# Patient Record
Sex: Male | Born: 1970 | Race: White | Hispanic: No | Marital: Single | Smoking: Current every day smoker
Health system: Southern US, Community
[De-identification: ages and names within clinical notes are randomized; demographics above are authoritative.]

## PROBLEM LIST (undated history)

## (undated) DIAGNOSIS — K219 Gastro-esophageal reflux disease without esophagitis: Secondary | ICD-10-CM

## (undated) DIAGNOSIS — I10 Essential (primary) hypertension: Secondary | ICD-10-CM

---

## 2016-02-17 ENCOUNTER — Emergency Department (HOSPITAL_BASED_OUTPATIENT_CLINIC_OR_DEPARTMENT_OTHER)
Admission: EM | Admit: 2016-02-17 | Discharge: 2016-02-18 | Discharge status: Against Medical Advice | Payer: Self-pay | Attending: Emergency Medicine | Admitting: Emergency Medicine

## 2016-02-17 ENCOUNTER — Other Ambulatory Visit: Payer: Self-pay

## 2016-02-17 ENCOUNTER — Emergency Department (HOSPITAL_BASED_OUTPATIENT_CLINIC_OR_DEPARTMENT_OTHER): Payer: Self-pay

## 2016-02-17 ENCOUNTER — Encounter (HOSPITAL_BASED_OUTPATIENT_CLINIC_OR_DEPARTMENT_OTHER): Payer: Self-pay

## 2016-02-17 DIAGNOSIS — R0789 Other chest pain: Secondary | ICD-10-CM | POA: Insufficient documentation

## 2016-02-17 DIAGNOSIS — Z79899 Other long term (current) drug therapy: Secondary | ICD-10-CM | POA: Insufficient documentation

## 2016-02-17 DIAGNOSIS — F17210 Nicotine dependence, cigarettes, uncomplicated: Secondary | ICD-10-CM | POA: Insufficient documentation

## 2016-02-17 DIAGNOSIS — R079 Chest pain, unspecified: Secondary | ICD-10-CM

## 2016-02-17 DIAGNOSIS — I10 Essential (primary) hypertension: Secondary | ICD-10-CM | POA: Insufficient documentation

## 2016-02-17 HISTORY — DX: Gastro-esophageal reflux disease without esophagitis: K21.9

## 2016-02-17 HISTORY — DX: Essential (primary) hypertension: I10

## 2016-02-17 LAB — CBC
HCT: 44.6 % (ref 39.0–52.0)
Hemoglobin: 15.9 g/dL (ref 13.0–17.0)
MCH: 33.5 pg (ref 26.0–34.0)
MCHC: 35.7 g/dL (ref 30.0–36.0)
MCV: 93.9 fL (ref 78.0–100.0)
PLATELETS: 205 10*3/uL (ref 150–400)
RBC: 4.75 MIL/uL (ref 4.22–5.81)
RDW: 13.5 % (ref 11.5–15.5)
WBC: 6.8 10*3/uL (ref 4.0–10.5)

## 2016-02-17 LAB — BASIC METABOLIC PANEL
Anion gap: 9 (ref 5–15)
BUN: 8 mg/dL (ref 6–20)
CALCIUM: 9 mg/dL (ref 8.9–10.3)
CO2: 28 mmol/L (ref 22–32)
CREATININE: 0.94 mg/dL (ref 0.61–1.24)
Chloride: 101 mmol/L (ref 101–111)
GFR calc non Af Amer: >60 mL/min (ref >60)
Glucose, Bld: 103 mg/dL — ABNORMAL HIGH (ref 65–99)
Potassium: 4 mmol/L (ref 3.5–5.1)
SODIUM: 138 mmol/L (ref 135–145)

## 2016-02-17 LAB — TROPONIN I

## 2016-02-17 MED ORDER — ASPIRIN 81 MG PO CHEW
324.0000 mg | CHEWABLE_TABLET | Freq: Once | ORAL | Status: AC
  Administered 2016-02-17: 324 mg via ORAL
  Filled 2016-02-17: qty 4

## 2016-02-17 MED ORDER — NITROGLYCERIN 0.4 MG SL SUBL
0.4000 mg | SUBLINGUAL_TABLET | SUBLINGUAL | Status: DC | PRN
  Filled 2016-02-17: qty 1

## 2016-02-17 NOTE — ED Notes (Signed)
EKG was done in triage

## 2016-02-17 NOTE — ED Notes (Signed)
PA at bedside.

## 2016-02-17 NOTE — ED Notes (Signed)
Patient complains of chest heaviness x 3 hours. Reports the pain started after drinking a couple of beers and eating chicken. Takes omeprazole daily for GERD. Describes as uncomfortable feeling

## 2016-02-17 NOTE — ED Provider Notes (Signed)
CSN: [id number]     Arrival date & time 02/17/16  2101 History  By signing my name below, I, Majel Homereyton Lee, attest that this documentation has been prepared under the direction and in the presence of non-physician practitioner, Burna FortsJeff Caroly Purewal, PA-C. Electronically Signed: Majel HomerPeyton Lee, Scribe. 02/17/2016. 11:09 PM.   Chief Complaint  Patient presents with  . Chest Pain   The history is provided by the patient and the spouse. No language interpreter was used.   HPI Comments: Zachary Roman is a 45 y.o. male with PMHx of HTN and GERD, who presents to the Emergency Department complaining of gradual onset, right lower chest pressure that began at ~7:00pm this evening. He notes his chest pressure spreads across to his left chest as well. Pt states it feels as if "an elephant is sitting on his chest." Pt also reports his pressure is exacerbated when taking deep breaths. Pt states his symptoms began soon after eating dinner and drinking a few beers; he notes he recently ate barbecue chicken and noodles. Pt notes 1 episode of vomiting this evening. Pt notes he takes omeprazole twice daily for GERD. He reports he has experienced similar symptoms before in which he was seen at [facility] and told he was having a panic attack. Pt notes hx of pancreatitis. He reports he smokes at least 1 pack of cigarettes every day since he was 7. He denies FMHx of MI, hx of MI, blood clots, heart problems, DM, and HLD. Pt also denies leg swelling and any recent surgeries.   Past Medical History  Diagnosis Date  . Hypertension   . GERD (gastroesophageal reflux disease)    History reviewed. No pertinent past surgical history. No family history on file. Social History  Substance Use Topics  . Smoking status: Current Every Day Smoker    Types: Cigarettes  . Smokeless tobacco: None  . Alcohol Use: None    Review of Systems  Cardiovascular: Positive for chest pain. Negative for leg swelling.  Gastrointestinal:  Negative for vomiting.   Allergies  Review of patient's allergies indicates no known allergies.  Home Medications   Prior to Admission medications   Medication Sig Start Date End Date Taking? Authorizing Provider  omeprazole (PRILOSEC) 20 MG capsule Take 20 mg by mouth daily.   Yes Historical Provider, MD   BP 152/116 mmHg  Pulse 65  Temp(Src) 97.9 F (36.6 C) (Oral)  Resp 16  Ht 5\' 8"  (1.727 m)  Wt 68.04 kg  BMI 22.81 kg/m2  SpO2 99% Physical Exam  Constitutional: He is oriented to person, place, and time. He appears well-developed and well-nourished.  HENT:  Head: Normocephalic and atraumatic.  Eyes: Conjunctivae are normal. Pupils are equal, round, and reactive to light. Right eye exhibits no discharge. Left eye exhibits no discharge. No scleral icterus.  Neck: Normal range of motion. No JVD present. No tracheal deviation present.  Cardiovascular: Normal rate, regular rhythm, normal heart sounds and intact distal pulses.  Exam reveals no gallop and no friction rub.   No murmur heard. Pulmonary/Chest: Effort normal and breath sounds normal. No stridor. No respiratory distress.  Abdominal: Soft. He exhibits no distension and no mass. There is no tenderness. There is no rebound and no guarding.  Musculoskeletal: He exhibits no edema or tenderness.  Neurological: He is alert and oriented to person, place, and time. Coordination normal.  Skin: Skin is warm and dry. No rash noted. No erythema. No pallor.  Psychiatric: He has a normal mood and  affect. His behavior is normal. Judgment and thought content normal.  Nursing note and vitals reviewed.   ED Course  Procedures  DIAGNOSTIC STUDIES:  Oxygen Saturation is 100% on RA, normal by my interpretation.    COORDINATION OF CARE:  11:01 PM Discussed treatment plan with pt at bedside and pt agreed to plan.  Labs Review Labs Reviewed  BASIC METABOLIC PANEL - Abnormal; Notable for the following:    Glucose, Bld 103 (*)    All  other components within normal limits  CBC  TROPONIN I  TROPONIN I    Imaging Review Dg Chest 2 View  02/17/2016  CLINICAL DATA:  3 hour history of chest heaviness. EXAM: CHEST  2 VIEW COMPARISON:  No comparison studies available. FINDINGS: Hyperexpansion is noted bilaterally. The lungs are clear wiithout focal pneumonia, edema, pneumothorax or pleural effusion. The cardiopericardial silhouette is within normal limits for size. Telemetry leads overlie the chest. IMPRESSION: No acute cardiopulmonary findings Electronically Signed   By: Kennith Center M.D.   On: 02/17/2016 22:16   I have personally reviewed and evaluated these images and lab results as part of my medical decision-making.   EKG Interpretation   Date/Time:  Saturday February 17 2016 21:08:08 EDT Ventricular Rate:  71 PR Interval:  136 QRS Duration: 92 QT Interval:  408 QTC Calculation: 443 R Axis:   82 Text Interpretation:  Normal sinus rhythm T wave abnormality, consider  inferior ischemia Abnormal ECG Confirmed by Ranae Palms  MD, DAVID ([id number])  on 02/17/2016 11:13:42 PM      MDM  Labs: Troponin, BMP, CBC- no significant findings  Imaging: DG chest 2 view  Consults: Cardiology  Therapeutics: Aspirin, nitroglycerin  Discharge Meds:  Assessment/Plan: 45 year old male presents today with atypical chest pain. Patient has never been seen in the emergency room prior, here at request of his wife. Patient's symptoms described as indigestion and pressure, wife notes that he described it as pressure sitting on his chest. Initial EKG showed T wave changes in inferior leads, 2 troponins at 3 hour interval showed no elevation. Repeat EKG showed improvement of T wave abnormality. Patient has significant past medical history including extensive smoking, hypertension, and is 45 years old. Cardiology consult at who agreed that patient would likely benefit from ACS rule out with cardiology consult tomorrow. During patient's stay he  repeatedly made threats that he was be leading, he was upset that he had to be in the emergency room, he was talked into staying by his wife. Patient was instructed that the most appropriate care would be hospital admission for ACS rule out and cardiology follow-up. Patient adamantly refused hospital admission and requested discharge. He was aware of potential life-threatening, disabling consequences of not following up with current recommendations. Patient is in sound mind, and adamantly refused hospital admission. Both the patient and the wife verbalized their understanding of potential life-threatening consequences. Patient will be discharged home AGAINST MEDICAL ADVICE, he is instructed to return immediately to the emergency room, 911 if any new or worsening signs or symptoms present. He is given cardiology follow-up, encouraged to consult them first thing Monday morning and schedule follow-up evaluation. She verbalized understanding and agreement today's plan and had no further questions or concerns at time of discharge  Final diagnoses:  Chest pain, unspecified chest pain type    I personally performed the services described in this documentation, which was scribed in my presence. The recorded information has been reviewed and is accurate.    Eyvonne Mechanic, PA-C  02/18/16 0125  Loren Raceravid Yelverton, MD 02/20/16 0001

## 2016-02-17 NOTE — ED Notes (Signed)
Pt does not want nitroglycerin at this time.

## 2016-02-18 LAB — TROPONIN I: Troponin I: <0.03 ng/mL (ref <0.03)

## 2016-02-18 NOTE — Discharge Instructions (Signed)
Your diagnosis today's chest pain. Based on your EKG findings and presentation it is recommended that you be admitted to the hospital with observation and cardiology follow-up. He'll be discharged home AGAINST MEDICAL ADVICE. Please return immediately to the emergency room if any new or worsening signs or symptoms present. Please consult cardiologist first thing Monday morning and schedule follow-up evaluation.

## 2016-02-18 NOTE — ED Notes (Signed)
Pt made aware to return if symptoms worsen or if any life threatening symptoms occur.   

## 2017-02-23 IMAGING — CR DG CHEST 2V
2 series · 2 of 2 positions shown · non-contrast
Comparison: No comparison studies available.

CLINICAL DATA: 3 hour history of chest heaviness.

EXAM:
CHEST  2 VIEW

[w chest pa]
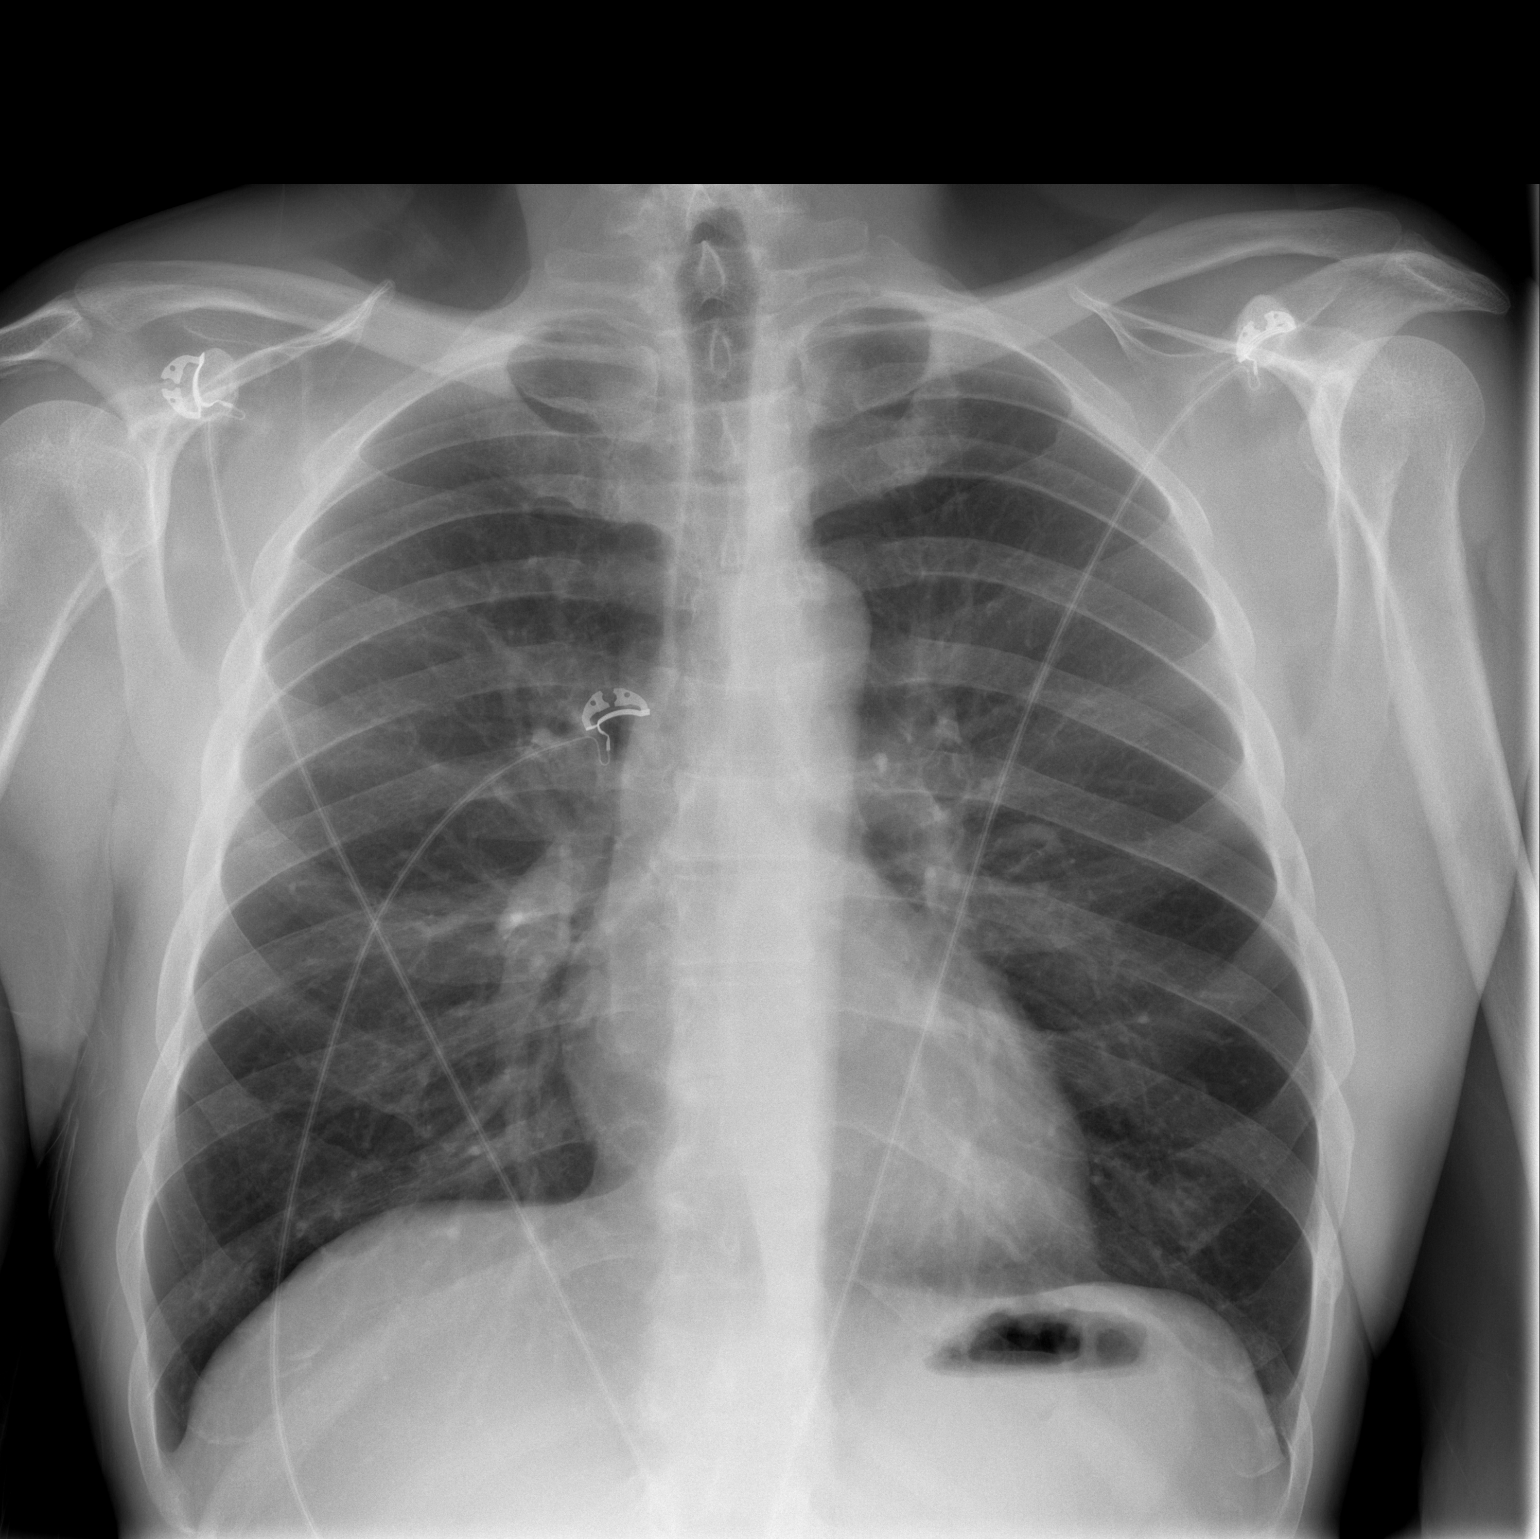

[w chest lat]
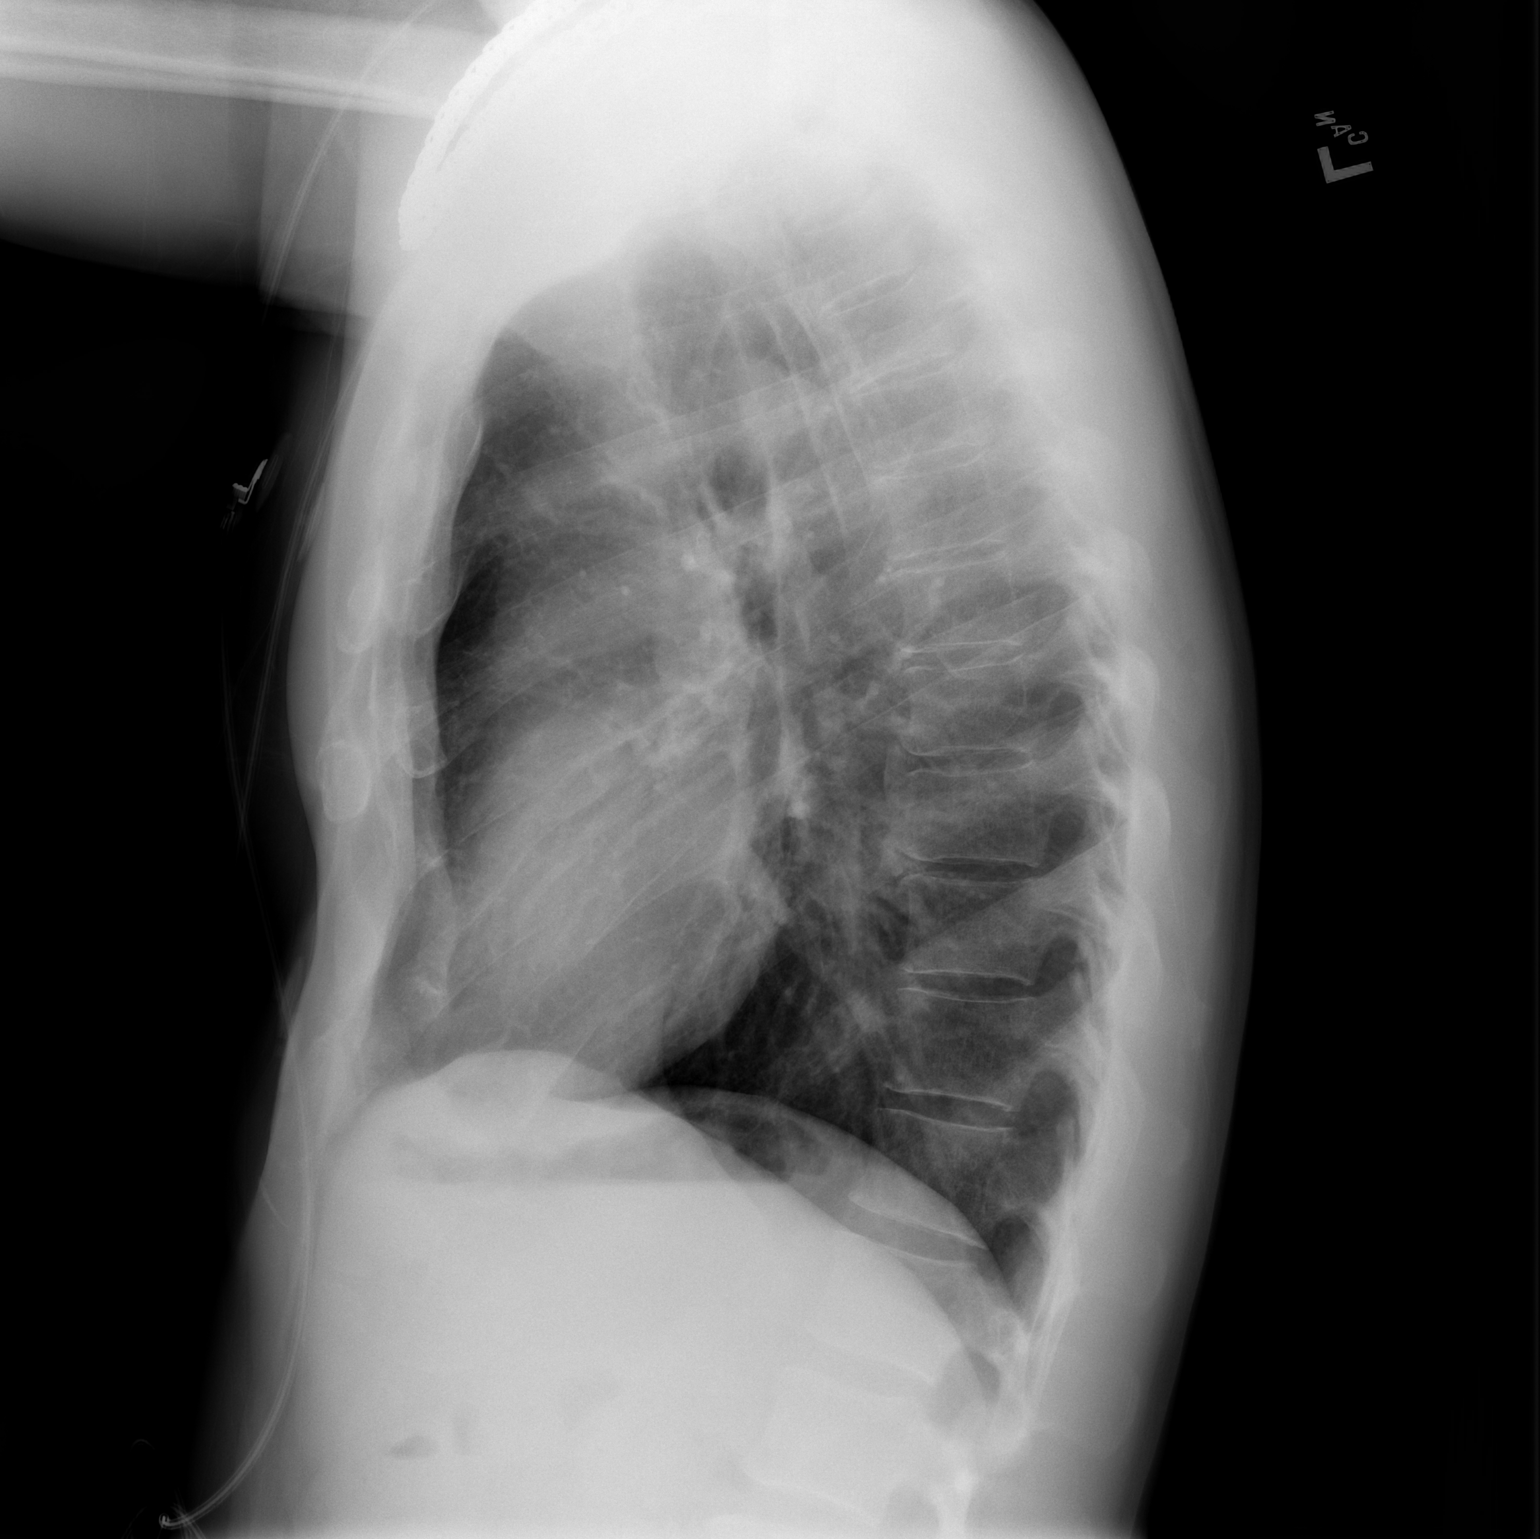

[2 of 2 positions shown; findings below may reference images not displayed]

FINDINGS: Hyperexpansion is noted bilaterally. The lungs are clear wiithout
focal pneumonia, edema, pneumothorax or pleural effusion. The
cardiopericardial silhouette is within normal limits for size.
Telemetry leads overlie the chest.
IMPRESSION: No acute cardiopulmonary findings

## 2019-07-15 MED ORDER — DURASORB UNDERPAD MISC
4.00 | Status: DC
Start: ? — End: 2019-07-15

## 2019-07-15 MED ORDER — GENERIC EXTERNAL MEDICATION
5.00 | Status: DC
Start: 2019-07-16 — End: 2019-07-15

## 2019-07-15 MED ORDER — GENERIC EXTERNAL MEDICATION
5.00 | Status: DC
Start: ? — End: 2019-07-15

## 2020-11-17 DEATH — deceased
